# Patient Record
Sex: Male | Born: 1965 | Race: White | Hispanic: No | Marital: Married
Health system: Southern US, Community
[De-identification: ages and names within clinical notes are randomized; demographics above are authoritative.]

---

## 2018-02-25 ENCOUNTER — Other Ambulatory Visit: Payer: Self-pay

## 2018-02-25 ENCOUNTER — Emergency Department (HOSPITAL_COMMUNITY): Payer: Managed Care, Other (non HMO)

## 2018-02-25 ENCOUNTER — Emergency Department (HOSPITAL_COMMUNITY)
Admission: EM | Admit: 2018-02-25 | Discharge: 2018-02-26 | Disposition: A | Discharge status: Home / Self Care | Payer: Managed Care, Other (non HMO) | Attending: Emergency Medicine | Admitting: Emergency Medicine

## 2018-02-25 DIAGNOSIS — F12122 Cannabis abuse with intoxication with perceptual disturbance: Secondary | ICD-10-CM | POA: Diagnosis not present

## 2018-02-25 DIAGNOSIS — R4182 Altered mental status, unspecified: Secondary | ICD-10-CM | POA: Diagnosis present

## 2018-02-25 DIAGNOSIS — F12922 Cannabis use, unspecified with intoxication with perceptual disturbance: Secondary | ICD-10-CM

## 2018-02-25 DIAGNOSIS — R55 Syncope and collapse: Secondary | ICD-10-CM | POA: Insufficient documentation

## 2018-02-25 DIAGNOSIS — R404 Transient alteration of awareness: Secondary | ICD-10-CM

## 2018-02-25 DIAGNOSIS — R4020 Unspecified coma: Secondary | ICD-10-CM

## 2018-02-25 LAB — URINALYSIS, ROUTINE W REFLEX MICROSCOPIC
Bacteria, UA: NONE SEEN
Bilirubin Urine: NEGATIVE
Hgb urine dipstick: NEGATIVE
Ketones, ur: 20 mg/dL — AB
Leukocytes, UA: NEGATIVE
Nitrite: NEGATIVE
Protein, ur: NEGATIVE mg/dL
Specific Gravity, Urine: 1.009 (ref 1.005–1.030)
pH: 5 (ref 5.0–8.0)

## 2018-02-25 LAB — COMPREHENSIVE METABOLIC PANEL
ALT: 59 U/L — ABNORMAL HIGH (ref 0–44)
AST: 31 U/L (ref 15–41)
Albumin: 4.4 g/dL (ref 3.5–5.0)
Alkaline Phosphatase: 42 U/L (ref 38–126)
Anion gap: 17 — ABNORMAL HIGH (ref 5–15)
BUN: 18 mg/dL (ref 6–20)
CO2: 20 mmol/L — ABNORMAL LOW (ref 22–32)
Calcium: 9 mg/dL (ref 8.9–10.3)
Chloride: 101 mmol/L (ref 98–111)
Creatinine, Ser: 1.47 mg/dL — ABNORMAL HIGH (ref 0.61–1.24)
GFR calc Af Amer: >60 mL/min (ref >60)
GFR calc non Af Amer: 54 mL/min — ABNORMAL LOW (ref >60)
GLUCOSE: 241 mg/dL — AB (ref 70–99)
Potassium: 3 mmol/L — ABNORMAL LOW (ref 3.5–5.1)
Sodium: 138 mmol/L (ref 135–145)
Total Bilirubin: 1.3 mg/dL — ABNORMAL HIGH (ref 0.3–1.2)
Total Protein: 6.9 g/dL (ref 6.5–8.1)

## 2018-02-25 LAB — POCT I-STAT EG7
Acid-base deficit: 4 mmol/L — ABNORMAL HIGH (ref 0.0–2.0)
Bicarbonate: 21 mmol/L (ref 20.0–28.0)
Calcium, Ion: 1.08 mmol/L — ABNORMAL LOW (ref 1.15–1.40)
HCT: 41 % (ref 39.0–52.0)
HEMOGLOBIN: 13.9 g/dL (ref 13.0–17.0)
O2 Saturation: 88 %
Potassium: 3 mmol/L — ABNORMAL LOW (ref 3.5–5.1)
Sodium: 139 mmol/L (ref 135–145)
TCO2: 22 mmol/L (ref 22–32)
pCO2, Ven: 37.7 mmHg — ABNORMAL LOW (ref 44.0–60.0)
pH, Ven: 7.354 (ref 7.250–7.430)
pO2, Ven: 57 mmHg — ABNORMAL HIGH (ref 32.0–45.0)

## 2018-02-25 LAB — CBC WITH DIFFERENTIAL/PLATELET
Abs Immature Granulocytes: 0.08 10*3/uL — ABNORMAL HIGH (ref 0.00–0.07)
Basophils Absolute: 0.1 10*3/uL (ref 0.0–0.1)
Basophils Relative: 1 %
Eosinophils Absolute: 0.3 10*3/uL (ref 0.0–0.5)
Eosinophils Relative: 2 %
HCT: 43 % (ref 39.0–52.0)
HEMOGLOBIN: 14.7 g/dL (ref 13.0–17.0)
Immature Granulocytes: 1 %
LYMPHS ABS: 4.6 10*3/uL — AB (ref 0.7–4.0)
LYMPHS PCT: 38 %
MCH: 31.1 pg (ref 26.0–34.0)
MCHC: 34.2 g/dL (ref 30.0–36.0)
MCV: 91.1 fL (ref 80.0–100.0)
Monocytes Absolute: 0.9 10*3/uL (ref 0.1–1.0)
Monocytes Relative: 8 %
Neutro Abs: 6.3 10*3/uL (ref 1.7–7.7)
Neutrophils Relative %: 50 %
Platelets: 257 10*3/uL (ref 150–400)
RBC: 4.72 MIL/uL (ref 4.22–5.81)
RDW: 12.2 % (ref 11.5–15.5)
WBC: 12.2 10*3/uL — ABNORMAL HIGH (ref 4.0–10.5)
nRBC: 0 % (ref 0.0–0.2)

## 2018-02-25 LAB — I-STAT TROPONIN, ED: Troponin i, poc: 0 ng/mL (ref 0.00–0.08)

## 2018-02-25 LAB — ETHANOL: Alcohol, Ethyl (B): 45 mg/dL — ABNORMAL HIGH (ref <10)

## 2018-02-25 LAB — AMMONIA: AMMONIA: 21 umol/L (ref 9–35)

## 2018-02-25 LAB — RAPID URINE DRUG SCREEN, HOSP PERFORMED
Amphetamines: NOT DETECTED
Barbiturates: NOT DETECTED
Benzodiazepines: NOT DETECTED
Cocaine: NOT DETECTED
Opiates: NOT DETECTED
Tetrahydrocannabinol: POSITIVE — AB

## 2018-02-25 LAB — SALICYLATE LEVEL: Salicylate Lvl: <7 mg/dL (ref 2.8–30.0)

## 2018-02-25 LAB — ACETAMINOPHEN LEVEL: Acetaminophen (Tylenol), Serum: <10 ug/mL — ABNORMAL LOW (ref 10–30)

## 2018-02-25 LAB — CBG MONITORING, ED: Glucose-Capillary: 209 mg/dL — ABNORMAL HIGH (ref 70–99)

## 2018-02-25 NOTE — ED Provider Notes (Signed)
[facility] Provider Note   CSN: [id number] Arrival date & time: 02/25/18  1842   History   Chief Complaint Chief Complaint  Patient presents with  . Loss of Consciousness    HPI Pernell DupreJeffrey S Poudrier is a 53 y.o. male.  HPI   Pernell DupreJeffrey S Milos is a 53 y.o. male with PMH of none per his report who presents [facility] from the scene of a party/get-together which his wife reports was a [employer] cook off that he does annually with friends.  Presents with acute altered mental status.  EMS was told that patient had taken about 8 shots of liquor and then smoked a "vape pen".  Shortly thereafter he reportedly felt like he had a dry mouth and walked over to his vehicle to collect himself.  At that time he reportedly slowly got down to his knees and then laid on the ground.  He was responsive to painful and verbal stimuli for EMS on first point of contact with no tachycardia or hypoglycemia or hypotension.  He was nondiaphoretic.  He intermittently follow commands.  EKG reportedly normal for EMS.  He is unable provide much history at this time but patient's wife spoke to members at the party on the phone while was in the room with her.  They stated he had probably 3-4 mixed drinks with vodka and it over the course of the last couple of hours.  Unsure if he smoked anything.  Toward the end of the interview the patient did report drinking alcohol by saying yes but said no and shook his head when asked if he smoked anything.  She reports he has not a regular drinker and takes no medications.  Does not see a PCP.  Has no known medical problems.  No known allergies.  No past medical history on file.  There are no active problems to display for this patient.    Home Medications    Prior to Admission medications   Not on File    Family History No family history on file.  Social History Social History   Tobacco Use  . Smoking status: Not on file    Substance Use Topics  . Alcohol use: Not on file  . Drug use: Not on file     Allergies   Patient has no known allergies.   Review of Systems Review of Systems  Reason unable to perform ROS: hx provided by patient after he returned to his baseline mental status.  Constitutional: Negative for chills and fever.  HENT: Negative for ear pain and sore throat.        Increased hearing perception prior to AMS   Eyes: Negative for pain and visual disturbance.  Respiratory: Negative for cough and shortness of breath.   Cardiovascular: Negative for chest pain and palpitations.  Gastrointestinal: Negative for abdominal pain and vomiting.  Genitourinary: Negative for dysuria and hematuria.  Musculoskeletal: Negative for arthralgias and back pain.  Skin: Negative for color change and rash.  Neurological: Positive for dizziness, weakness and light-headedness. Negative for seizures, syncope and numbness.  All other systems reviewed and are negative.    Physical Exam Updated Vital Signs BP 120/78 (BP Location: Right Arm)   Pulse 88   Temp 97.8 F (36.6 C)   Resp 18   Ht 6' (1.829 m)   Wt 97.5 kg   SpO2 98%   BMI 29.16 kg/m   Physical Exam Vitals signs and nursing note reviewed.  Constitutional:      Appearance: Normal appearance. He is well-developed and overweight.  HENT:     Head: Normocephalic and atraumatic.  Eyes:     Conjunctiva/sclera: Conjunctivae normal.  Neck:     Musculoskeletal: Neck supple.  Cardiovascular:     Rate and Rhythm: Normal rate and regular rhythm.     Heart sounds: S1 normal and S2 normal. No murmur.  Pulmonary:     Effort: Pulmonary effort is normal. No respiratory distress.     Breath sounds: Normal breath sounds.  Abdominal:     General: Abdomen is flat.     Palpations: Abdomen is soft.     Tenderness: There is no abdominal tenderness. There is no guarding or rebound. Negative signs include Murphy's sign, Rovsing's sign and McBurney's sign.   Skin:    General: Skin is warm and dry.  Neurological:     Mental Status: He is lethargic.     GCS: GCS eye subscore is 3. GCS verbal subscore is 4. GCS motor subscore is 5.     Comments: See neuro exam in MDM after patient returned to [facility].       ED Treatments / Results  Labs (all labs ordered are listed, but only abnormal results are displayed) Labs Reviewed  CBC WITH DIFFERENTIAL/PLATELET - Abnormal; Notable for the following components:      Result Value   WBC 12.2 (*)    Lymphs Abs 4.6 (*)    Abs Immature Granulocytes 0.08 (*)    All other components within normal limits  COMPREHENSIVE METABOLIC PANEL - Abnormal; Notable for the following components:   Potassium 3.0 (*)    CO2 20 (*)    Glucose, Bld 241 (*)    Creatinine, Ser 1.47 (*)    ALT 59 (*)    Total Bilirubin 1.3 (*)    GFR calc non Af Amer 54 (*)    Anion gap 17 (*)    All other components within normal limits  ETHANOL - Abnormal; Notable for the following components:   Alcohol, Ethyl (B) 45 (*)    All other components within normal limits  ACETAMINOPHEN LEVEL - Abnormal; Notable for the following components:   Acetaminophen (Tylenol), Serum <10 (*)    All other components within normal limits  URINALYSIS, ROUTINE W REFLEX MICROSCOPIC - Abnormal; Notable for the following components:   Glucose, UA >=500 (*)    Ketones, ur 20 (*)    All other components within normal limits  RAPID URINE DRUG SCREEN, HOSP PERFORMED - Abnormal; Notable for the following components:   Tetrahydrocannabinol POSITIVE (*)    All other components within normal limits  CBG MONITORING, ED - Abnormal; Notable for the following components:   Glucose-Capillary 209 (*)    All other components within normal limits  POCT I-STAT EG7 - Abnormal; Notable for the following components:   pCO2, Ven 37.7 (*)    pO2, Ven 57.0 (*)    Acid-base deficit 4.0 (*)    Potassium 3.0 (*)    Calcium, Ion 1.08 (*)    All other components within normal  limits  SALICYLATE LEVEL  AMMONIA  BLOOD GAS, VENOUS  I-STAT TROPONIN, ED    EKG EKG Interpretation  Date/Time:  Saturday February 25 2018 19:51:32 EST Ventricular Rate:  88 PR Interval:    QRS Duration: 103 QT Interval:  377 QTC Calculation: 457 R Axis:   91 Text Interpretation:  Sinus rhythm Borderline right axis deviation Nonspecific T wave abnormality Confirmed  by Cathren LaineSteinl, Kevin ([id number]) on 02/25/2018 8:01:02 PM   Radiology Ct Head Wo Contrast  Result Date: 02/25/2018 CLINICAL DATA:  Altered level of consciousness (LOC), unexplained EXAM: CT HEAD WITHOUT CONTRAST TECHNIQUE: Contiguous axial images were obtained from the base of the skull through the vertex without intravenous contrast. COMPARISON:  None. FINDINGS: Brain: No intracranial hemorrhage, mass effect, or midline shift. No hydrocephalus. The basilar cisterns are patent. No evidence of territorial infarct or acute ischemia. No extra-axial or intracranial fluid collection. Vascular: No hyperdense vessel. Skull: No fracture or focal lesion. Sinuses/Orbits: Scattered mucosal thickening of ethmoid air cells without fluid level. Mastoid air cells are clear. Included orbits are unremarkable. Other: None. IMPRESSION: No acute intracranial abnormality. Electronically Signed   By: Narda RutherfordMelanie  Sanford M.D.   On: 02/25/2018 21:04   Dg Chest Portable 1 View  Result Date: 02/25/2018 CLINICAL DATA:  Recent syncopal episode EXAM: PORTABLE CHEST 1 VIEW COMPARISON:  None. FINDINGS: The heart size and mediastinal contours are within normal limits. Both lungs are clear. The visualized skeletal structures are unremarkable. IMPRESSION: No active disease. Electronically Signed   By: Alcide CleverMark  Lukens M.D.   On: 02/25/2018 20:10    Procedures Procedures (including critical care time)  Medications Ordered in ED Medications - No data to display   Initial Impression / Assessment and Plan / ED Course  I have reviewed the triage vital signs and the nursing  notes.  Pertinent labs & imaging results that were available during my care of the patient were reviewed by me and considered in my medical decision making (see chart for details).     MDM:  Imaging: Chest x-ray normal.  CT head shows no acute intracranial normality.  ED Provider Interpretation of EKG: Sinus rhythm with a rate of 88 bpm, normal axis, no ST segment elevation or depression, pathologic T wave changes or significant interval irregularity.  No bundle-branch blocks.  No delta wave.  No evidence for Brugada syndrome.  Labs: Troponin 0, VBG 7.3 5/37/57/21, ammonia 21, salicylate negative, Tylenol negative, ethanol 45, CMP with potassium of 3.0, CO2 20, glucose 241, creatinine 1.47, ALT 59, CBC with a white count of 12.2, UDS positive for THC, UA with glucosuria otherwise unremarkable  On initial evaluation, patient appears sleepy and arousable to voice. Afebrile and hemodynamically stable.  Presents after apparent change in mental status/possible LOC as detailed above in the HPI.  On initial exam, patient would respond to painful stimuli and verbal stimulation but was very sleepy.  No tachycardia and normotensive.  End-tidal normal.  Satting mid to high 90s on room air without difficulty.  Breath sounds normal.  Heart sounds normal.  Equal pulses in the upper and lower extremities.  EKG shows no evidence for acute ischemia or arrhythmia.  Troponin was undetectable.  No history of CAD or CHF and does not appear volume overloaded.  No evidence for DVT on exam and low suspicion for PE as well.  Low suspicion for dissection.  Labs as above with creatinine slightly elevated at 1.4 with no prior available for comparison.  Glucose normal.  Ammonia normal.  No evidence for toxic ingestion other than alcohol 45.  Venous blood gas unremarkable as above.  CT head and chest x-ray normal.  UDS positive for THC.  After patient was able to metabolize he progressively became more alert and oriented.   Returned to his baseline mental status after period of metabolism.  Patient stated that he remembers everything about the day but there was a  period where he felt strange at the get together with his friends.  Unregulated breakfast with his wife into the store.  Reports that he took 1 shot of "brown liquor" and then had a mix drink with vodka over the course of 3 to 4 hours.  Then around 40 minutes prior to arrival the patient reports that he took 1 "hit" off of a "Vape pen" that he believes contained highly concentrated THC oil.  About 20 minutes after that he started to feel that he could hear everything around him much flatter than normal.  Started to make him uncomfortable.  He walked out to his car and then called for his friend to come over because he stated that he felt uncomfortable.  After the patient states he felt slightly lightheaded and generally weak.  He then went down to 1 knee and then laid down.  He remembers hearing his friends and other people around him but had difficulty waking up enough to talk to them.  He had no pain during this time.  No palpitations.  At this time he states he feels normal.  His exam is normal with no neurologic deficit.  No dysmetria or dysdiadochokinesia.  No nystagmus and no ataxia.  Able to ambulate in the halls without assistance.  Tolerating p.o. without difficulty.  Consider possible arrhythmia, seizure, other cardiogenic syncope, and TIA/CVA but low suspicion for these etiologies at this time.  The patient continued to remain difficult to arouse on arrival although his vitals and EKG were normal.  Patient has no history of seizure and CT head was normal.  No history of CVA.  No urinary incontinence or tongue biting.  He was not diaphoretic.  Low suspicion for seizure.  Suspect majority of his symptoms were secondary to exposure to highly concentrated likely elevated dose of THC.  He is not a regular user of marijuana.  Also component of alcohol intoxication at  the time of his initial symptoms.  He does not require additional testing at this time but does require PCP follow-up and outpatient testing.  He verbalized understanding and was discharged in stable condition with instructions for PCP follow-up and given return precautions.  The plan for this patient was discussed with Dr. Denton Lank who voiced agreement and who oversaw evaluation and treatment of this patient.   The patient was fully informed and involved with the history taking, evaluation, workup including labs/images, and plan. The patient's concerns and questions were addressed to the patient's satisfaction and he expressed agreement with the plan to DC home.    Final Clinical Impressions(s) / ED Diagnoses   Final diagnoses:  Loss of consciousness (HCC)  Altered level of consciousness  Intoxication with marijuana, with perceptual disturbance [facility])    ED Discharge Orders    None       Lorren Splawn, Sherryle Lis, MD 02/26/18 0101    Cathren Laine, MD 02/28/18 1208

## 2018-02-25 NOTE — ED Notes (Signed)
Urine culture sent down with sample 

## 2018-02-25 NOTE — ED Notes (Signed)
Pt given more water. He and his wife is aware he needs a urine sample

## 2018-02-25 NOTE — ED Triage Notes (Signed)
Arrives from barn party, per witnesses took approx 8 shots of liquor and smoking vape pen. Stated he was feeling like he was getting "cotton mouth" and got on knees and then fell to ground. Upon arrival, responsive to pain but not verbal

## 2018-02-25 NOTE — ED Notes (Signed)
Became more awake during triage, now following some commands, shakes head at commands he does not want to follow

## 2019-08-14 IMAGING — DX DG CHEST 1V PORT
1 series · 1 of 1 positions shown · non-contrast
Comparison: None.

CLINICAL DATA: Recent syncopal episode

EXAM:
PORTABLE CHEST 1 VIEW

[chest]
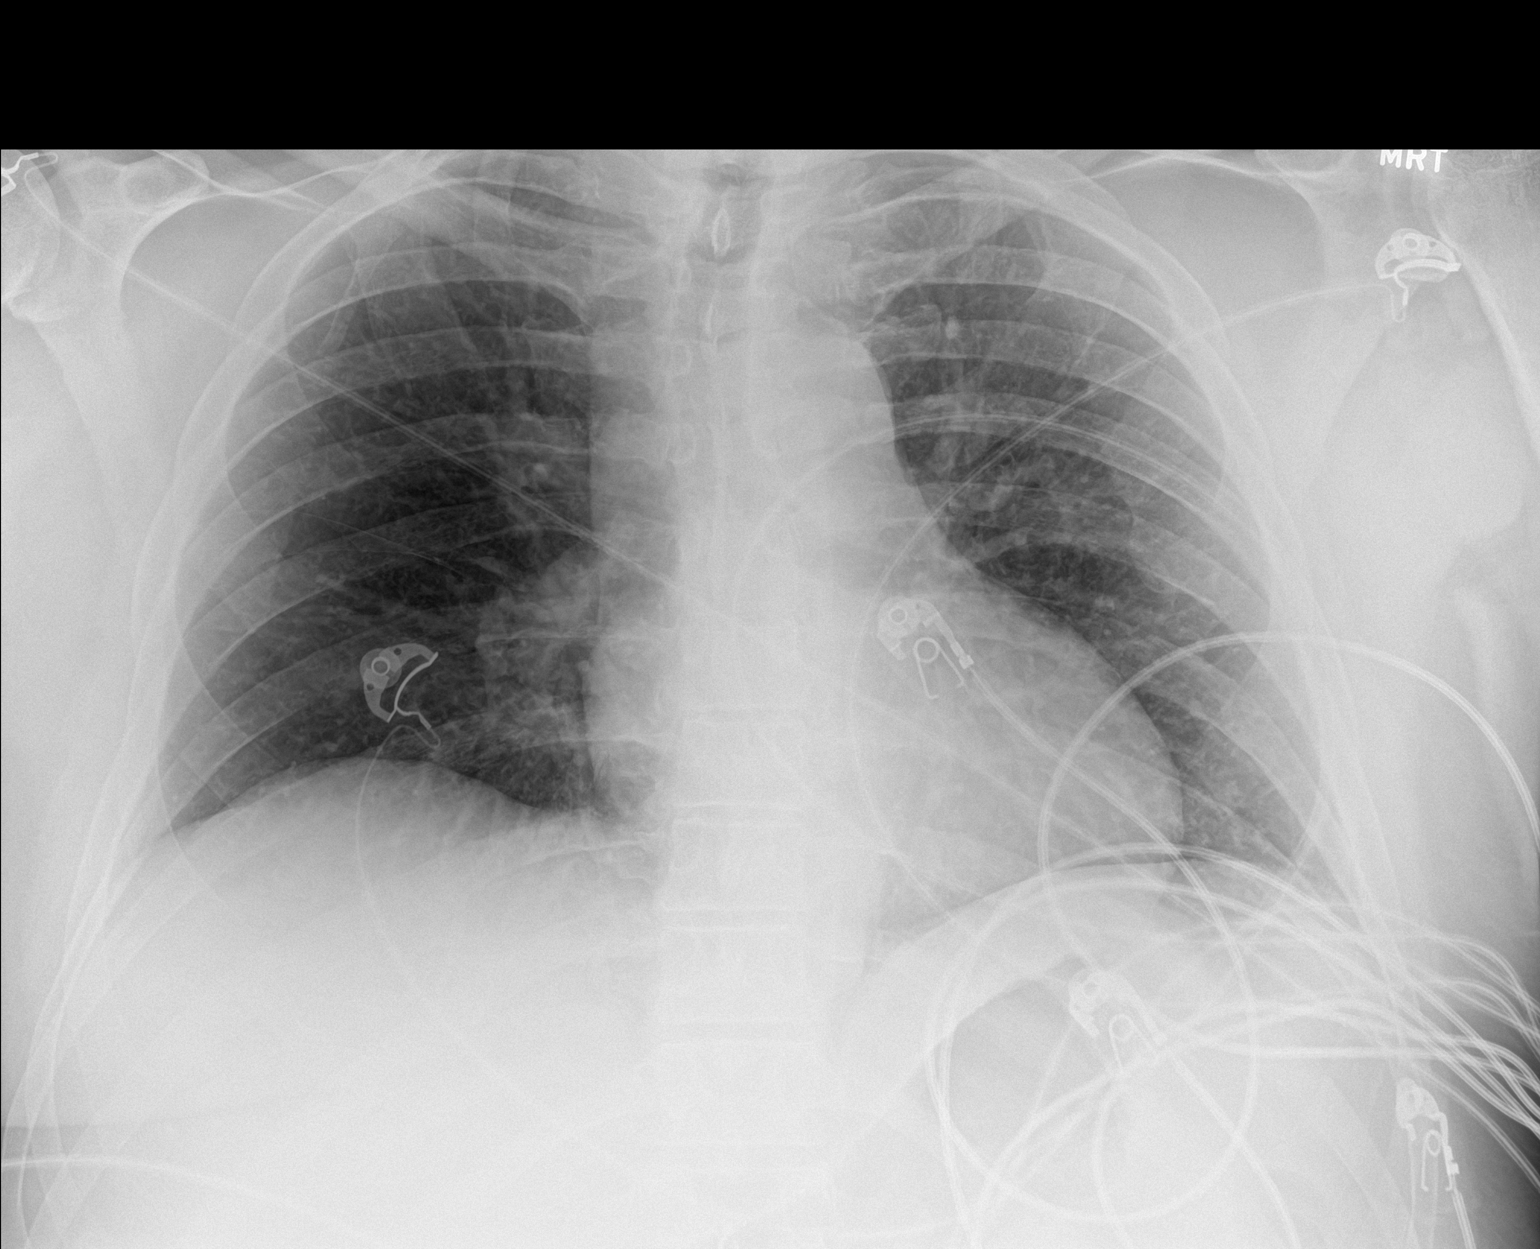

[1 of 1 positions shown; findings below may reference images not displayed]

FINDINGS: The heart size and mediastinal contours are within normal limits.
Both lungs are clear. The visualized skeletal structures are
unremarkable.
IMPRESSION: No active disease.
